# Patient Record
Sex: Male | Born: 1985 | Race: Black or African American | Hispanic: No | Marital: Single | Smoking: Current some day smoker
Health system: Southern US, Community
[De-identification: ages and names within clinical notes are randomized; demographics above are authoritative.]

## PROBLEM LIST (undated history)

## (undated) DIAGNOSIS — E669 Obesity, unspecified: Secondary | ICD-10-CM

## (undated) HISTORY — PX: HAND SURGERY: SHX662

---

## 2008-11-16 ENCOUNTER — Emergency Department (HOSPITAL_BASED_OUTPATIENT_CLINIC_OR_DEPARTMENT_OTHER): Admission: EM | Admit: 2008-11-16 | Discharge: 2008-11-16 | Payer: Self-pay | Admitting: Emergency Medicine

## 2009-09-06 ENCOUNTER — Emergency Department (HOSPITAL_BASED_OUTPATIENT_CLINIC_OR_DEPARTMENT_OTHER): Admission: EM | Admit: 2009-09-06 | Discharge: 2009-09-06 | Payer: Self-pay | Admitting: Emergency Medicine

## 2009-11-06 ENCOUNTER — Ambulatory Visit: Payer: Self-pay | Admitting: Diagnostic Radiology

## 2009-11-06 ENCOUNTER — Emergency Department (HOSPITAL_BASED_OUTPATIENT_CLINIC_OR_DEPARTMENT_OTHER): Admission: EM | Admit: 2009-11-06 | Discharge: 2009-11-06 | Payer: Self-pay | Admitting: Emergency Medicine

## 2010-01-07 ENCOUNTER — Emergency Department (HOSPITAL_BASED_OUTPATIENT_CLINIC_OR_DEPARTMENT_OTHER): Admission: EM | Admit: 2010-01-07 | Discharge: 2010-01-07 | Payer: Self-pay | Admitting: Emergency Medicine

## 2010-01-07 ENCOUNTER — Ambulatory Visit: Payer: Self-pay | Admitting: Diagnostic Radiology

## 2010-07-09 ENCOUNTER — Emergency Department (HOSPITAL_BASED_OUTPATIENT_CLINIC_OR_DEPARTMENT_OTHER): Admission: EM | Admit: 2010-07-09 | Discharge: 2010-07-09 | Payer: Self-pay | Admitting: Emergency Medicine

## 2010-07-09 ENCOUNTER — Ambulatory Visit: Payer: Self-pay | Admitting: Radiology

## 2010-07-23 ENCOUNTER — Ambulatory Visit (HOSPITAL_BASED_OUTPATIENT_CLINIC_OR_DEPARTMENT_OTHER): Admission: RE | Admit: 2010-07-23 | Discharge: 2010-07-23 | Payer: Self-pay | Admitting: Plastic Surgery

## 2010-08-14 ENCOUNTER — Ambulatory Visit: Payer: Self-pay | Admitting: Diagnostic Radiology

## 2010-08-14 ENCOUNTER — Encounter
Admission: RE | Admit: 2010-08-14 | Discharge: 2010-09-19 | Payer: Self-pay | Source: Home / Self Care | Attending: Plastic Surgery | Admitting: Plastic Surgery

## 2010-08-14 ENCOUNTER — Emergency Department (HOSPITAL_BASED_OUTPATIENT_CLINIC_OR_DEPARTMENT_OTHER): Admission: EM | Admit: 2010-08-14 | Discharge: 2010-08-14 | Payer: Self-pay | Admitting: Emergency Medicine

## 2010-09-19 ENCOUNTER — Encounter
Admission: RE | Admit: 2010-09-19 | Discharge: 2010-10-05 | Payer: Self-pay | Source: Home / Self Care | Attending: Plastic Surgery | Admitting: Plastic Surgery

## 2010-11-25 ENCOUNTER — Emergency Department (HOSPITAL_BASED_OUTPATIENT_CLINIC_OR_DEPARTMENT_OTHER)
Admission: EM | Admit: 2010-11-25 | Discharge: 2010-11-25 | Disposition: A | Discharge status: Home / Self Care | Payer: Self-pay | Attending: Emergency Medicine | Admitting: Emergency Medicine

## 2010-11-25 ENCOUNTER — Emergency Department (INDEPENDENT_AMBULATORY_CARE_PROVIDER_SITE_OTHER): Payer: Self-pay

## 2010-11-25 DIAGNOSIS — J45909 Unspecified asthma, uncomplicated: Secondary | ICD-10-CM

## 2010-11-25 DIAGNOSIS — R0602 Shortness of breath: Secondary | ICD-10-CM | POA: Insufficient documentation

## 2010-11-25 DIAGNOSIS — J45901 Unspecified asthma with (acute) exacerbation: Secondary | ICD-10-CM | POA: Insufficient documentation

## 2010-12-04 LAB — RAPID STREP SCREEN (MED CTR MEBANE ONLY): Streptococcus, Group A Screen (Direct): NEGATIVE

## 2010-12-05 LAB — POCT HEMOGLOBIN-HEMACUE: Hemoglobin: 14.7 g/dL (ref 13.0–17.0)

## 2011-05-15 ENCOUNTER — Emergency Department (HOSPITAL_BASED_OUTPATIENT_CLINIC_OR_DEPARTMENT_OTHER)
Admission: EM | Admit: 2011-05-15 | Discharge: 2011-05-15 | Disposition: A | Discharge status: Home / Self Care | Payer: Self-pay | Attending: Emergency Medicine | Admitting: Emergency Medicine

## 2011-05-15 ENCOUNTER — Encounter: Payer: Self-pay | Admitting: *Deleted

## 2011-05-15 ENCOUNTER — Other Ambulatory Visit: Payer: Self-pay

## 2011-05-15 ENCOUNTER — Emergency Department (INDEPENDENT_AMBULATORY_CARE_PROVIDER_SITE_OTHER): Payer: Self-pay

## 2011-05-15 DIAGNOSIS — J45909 Unspecified asthma, uncomplicated: Secondary | ICD-10-CM | POA: Insufficient documentation

## 2011-05-15 DIAGNOSIS — R079 Chest pain, unspecified: Secondary | ICD-10-CM | POA: Insufficient documentation

## 2011-05-15 DIAGNOSIS — R0789 Other chest pain: Secondary | ICD-10-CM

## 2011-05-15 NOTE — ED Provider Notes (Signed)
History     CSN: [id number] Arrival date & time: 05/15/2011  4:48 PM  Chief Complaint  Patient presents with  . Chest Pain   Patient is a 25 y.o. male presenting with chest pain. The history is provided by the patient (Girlfriend).  Chest Pain The chest pain began 1 - 2 hours ago. Duration of episode(s) is 1 hour. Chest pain occurs constantly. The chest pain is improving. The pain is associated with stress (Occurred during a heated discussion regarding finances). The severity of the pain is moderate. The quality of the pain is described as aching. The pain does not radiate. Chest pain is worsened by stress. Pertinent negatives for primary symptoms include no fever, no fatigue, no syncope, no cough, no wheezing, no palpitations, no nausea, no vomiting and no dizziness. Primary symptoms comment: No diaphoresis no near-syncope.  No palpitations  Pertinent negatives for associated symptoms include no diaphoresis. He tried nothing for the symptoms. Risk factors: Father with a history of nonischemic cardiomyopathy.. no family history of early coronary artery disease.  No early death. Past medical history comments: Patient smokes tobacco and marijuana.     Past Medical History  Diagnosis Date  . Asthma     History reviewed. No pertinent past surgical history.  History reviewed. No pertinent family history.  History  Substance Use Topics  . Smoking status: Never Smoker   . Smokeless tobacco: Not on file  . Alcohol Use: Yes     occ      Review of Systems  Constitutional: Negative for fever, diaphoresis and fatigue.  Respiratory: Negative for cough and wheezing.   Cardiovascular: Positive for chest pain. Negative for palpitations and syncope.  Gastrointestinal: Negative for nausea and vomiting.  Neurological: Negative for dizziness.  All other systems reviewed and are negative.    Physical Exam  BP 125/70  Pulse 97  Temp(Src) 98.6 F (37 C) (Oral)  Resp 22  SpO2  100%  Physical Exam  Nursing note and vitals reviewed. Constitutional: He is oriented to person, place, and time. He appears well-developed and well-nourished.  HENT:  Head: Normocephalic and atraumatic.  Eyes: EOM are normal.  Neck: Normal range of motion.  Cardiovascular: Normal rate, regular rhythm, normal heart sounds and intact distal pulses.   Pulmonary/Chest: Effort normal and breath sounds normal. No respiratory distress.  Abdominal: Soft. He exhibits no distension. There is no tenderness.  Musculoskeletal: Normal range of motion.  Neurological: He is alert and oriented to person, place, and time.  Skin: Skin is warm and dry.  Psychiatric: He has a normal mood and affect. Judgment normal.    ED Course  Procedures  MDM  Date: 05/15/2011  Rate: 95  Rhythm: normal sinus rhythm  QRS Axis: normal  Intervals: normal  ST/T Wave abnormalities: normal  Conduction Disutrbances:none  Narrative Interpretation:   Old EKG Reviewed: none available  My suspicion for acute coronary syndrome in this 25 year old he is very low.  EKG is normal.  Chest x-ray pending at this time.  Likely home with outpatient cardiology followup.  Suspect this is related to stress and anxiety of his financial discussion.   Dg Chest 2 View  05/15/2011  *RADIOLOGY REPORT*  Clinical Data: Left-sided chest pain  CHEST - 2 VIEW  Comparison: 11/25/2010  Findings:  Heart size upper normal.  Negative for heart failure. Negative for pneumonia or effusion.  Lungs are clear.  IMPRESSION: No active cardiopulmonary disease.  Original Report Authenticated By: Camelia Phenes, M.D.  Lyanne Co, MD 05/15/11 [phone]

## 2011-05-15 NOTE — ED Notes (Signed)
Pt states was at work and got a bit upset started having some tightness in chest states he used his inhaler thinking it might help denies nausea vomiting diaphoresis pt talking on cell phone in triage

## 2014-03-05 ENCOUNTER — Encounter (HOSPITAL_BASED_OUTPATIENT_CLINIC_OR_DEPARTMENT_OTHER): Payer: Self-pay | Admitting: Emergency Medicine

## 2014-03-05 ENCOUNTER — Telehealth (HOSPITAL_BASED_OUTPATIENT_CLINIC_OR_DEPARTMENT_OTHER): Payer: Self-pay | Admitting: *Deleted

## 2014-03-05 ENCOUNTER — Emergency Department (HOSPITAL_BASED_OUTPATIENT_CLINIC_OR_DEPARTMENT_OTHER)
Admission: EM | Admit: 2014-03-05 | Discharge: 2014-03-05 | Disposition: A | Discharge status: Home / Self Care | Payer: 59 | Attending: Emergency Medicine | Admitting: Emergency Medicine

## 2014-03-05 ENCOUNTER — Emergency Department (HOSPITAL_BASED_OUTPATIENT_CLINIC_OR_DEPARTMENT_OTHER): Payer: 59

## 2014-03-05 DIAGNOSIS — IMO0002 Reserved for concepts with insufficient information to code with codable children: Secondary | ICD-10-CM | POA: Insufficient documentation

## 2014-03-05 DIAGNOSIS — Z79899 Other long term (current) drug therapy: Secondary | ICD-10-CM | POA: Insufficient documentation

## 2014-03-05 DIAGNOSIS — J45901 Unspecified asthma with (acute) exacerbation: Secondary | ICD-10-CM

## 2014-03-05 DIAGNOSIS — E669 Obesity, unspecified: Secondary | ICD-10-CM | POA: Insufficient documentation

## 2014-03-05 MED ORDER — IPRATROPIUM BROMIDE 0.02 % IN SOLN
0.5000 mg | Freq: Once | RESPIRATORY_TRACT | Status: AC
  Administered 2014-03-05: 0.5 mg via RESPIRATORY_TRACT
  Filled 2014-03-05: qty 2.5

## 2014-03-05 MED ORDER — PREDNISONE 50 MG PO TABS
60.0000 mg | ORAL_TABLET | Freq: Once | ORAL | Status: AC
  Administered 2014-03-05: 60 mg via ORAL
  Filled 2014-03-05 (×2): qty 1

## 2014-03-05 MED ORDER — ALBUTEROL SULFATE (2.5 MG/3ML) 0.083% IN NEBU
2.5000 mg | INHALATION_SOLUTION | Freq: Once | RESPIRATORY_TRACT | Status: AC
  Administered 2014-03-05: 2.5 mg via RESPIRATORY_TRACT
  Filled 2014-03-05: qty 3

## 2014-03-05 MED ORDER — PREDNISONE 10 MG PO TABS: 40.0000 mg | ORAL_TABLET | Freq: Every day | ORAL | Status: DC

## 2014-03-05 MED ORDER — ALBUTEROL SULFATE HFA 108 (90 BASE) MCG/ACT IN AERS
2.0000 | INHALATION_SPRAY | Freq: Four times a day (QID) | RESPIRATORY_TRACT | Status: DC
  Administered 2014-03-05: 2 via RESPIRATORY_TRACT
  Filled 2014-03-05: qty 6.7

## 2014-03-05 NOTE — Discharge Instructions (Signed)
Asthma, Acute Bronchospasm °Acute bronchospasm caused by asthma is also referred to as an asthma attack. Bronchospasm means your air passages become narrowed. The narrowing is caused by inflammation and tightening of the muscles in the air tubes (bronchi) in your lungs. This can make it hard to breath or cause you to wheeze and cough. °CAUSES °Possible triggers are: °· Animal dander from the skin, hair, or feathers of animals. °· Dust mites contained in house dust. °· Cockroaches. °· Pollen from trees or grass. °· Mold. °· Cigarette or tobacco smoke. °· Air pollutants such as dust, household cleaners, hair sprays, aerosol sprays, paint fumes, strong chemicals, or strong odors. °· Cold air or weather changes. Cold air may trigger inflammation. Winds increase molds and pollens in the air. °· Strong emotions such as crying or laughing hard. °· Stress. °· Certain medicines such as aspirin or beta-blockers. °· Sulfites in foods and drinks, such as dried fruits and wine. °· Infections or inflammatory conditions, such as a flu, cold, or inflammation of the nasal membranes (rhinitis). °· Gastroesophageal reflux disease (GERD). GERD is a condition where stomach acid backs up into your throat (esophagus). °· Exercise or strenuous activity. °SIGNS AND SYMPTOMS  °· Wheezing. °· Excessive coughing, particularly at night. °· Chest tightness. °· Shortness of breath. °DIAGNOSIS  °Your health care provider will ask you about your medical history and perform a physical exam. A chest X-ray or blood testing may be performed to look for other causes of your symptoms or other conditions that may have triggered your asthma attack.  °TREATMENT  °Treatment is aimed at reducing inflammation and opening up the airways in your lungs.  Most asthma attacks are treated with inhaled medicines. These include quick relief or rescue medicines (such as bronchodilators) and controller medicines (such as inhaled corticosteroids). These medicines are  sometimes given through an inhaler or a nebulizer. Systemic steroid medicine taken by mouth or given through an IV tube also can be used to reduce the inflammation when an attack is moderate or severe. Antibiotic medicines are only used if a bacterial infection is present.  °HOME CARE INSTRUCTIONS  °· Rest. °· Drink plenty of liquids. This helps the mucus to remain thin and be easily coughed up. Only use caffeine in moderation and do not use alcohol until you have recovered from your illness. °· Do not smoke. Avoid being exposed to secondhand smoke. °· You play a critical role in keeping yourself in good health. Avoid exposure to things that cause you to wheeze or to have breathing problems. °· Keep your medicines up to date and available. Carefully follow your health care provider's treatment plan. °· Take your medicine exactly as prescribed. °· When pollen or pollution is bad, keep windows closed and use an air conditioner or go to places with air conditioning. °· Asthma requires careful medical care. See your health care provider for a follow-up as advised. If you are more than [redacted] weeks pregnant and you were prescribed any new medicines, let your obstetrician know about the visit and how you are doing. Follow-up with your health care provider as directed. °· After you have recovered from your asthma attack, make an appointment with your outpatient doctor to talk about ways to reduce the likelihood of future attacks. If you do not have a doctor who manages your asthma, make an appointment with a primary care doctor to discuss your asthma. °SEEK IMMEDIATE MEDICAL CARE IF:  °· You are getting worse. °· You have trouble breathing. If severe, call   your local emergency services (911 in the U.S.).  You develop chest pain or discomfort.  You are vomiting.  You are not able to keep fluids down.  You are coughing up yellow, green, brown, or bloody sputum.  You have a fever and your symptoms suddenly get  worse.  You have trouble swallowing. MAKE SURE YOU:   Understand these instructions.  Will watch your condition.  Will get help right away if you are not doing well or get worse. Document Released: 12/25/2006 Document Revised: 05/12/2013 Document Reviewed: 03/17/2013 [facility] Patient Information 2014 [place], [place]. You do not have pneumonia on your x-ray been started on a worse.  Of steroids.  Please take this as directed for the next 5, days.  You've also been supplied with an additional inhaler.  Please make every effort to find a primary care physician   Emergency Department Resource Guide 1) Find a Doctor and Pay Out of Pocket Although you won't have to find out who is covered by your insurance plan, it is a good idea to ask around and get recommendations. You will then need to call the office and see if the doctor you have chosen will accept you as a new patient and what types of options they offer for patients who are self-pay. Some doctors offer discounts or will set up payment plans for their patients who do not have insurance, but you will need to ask so you aren't surprised when you get to your appointment.  2) Contact Your Local Health Department Not all health departments have doctors that can see patients for sick visits, but many do, so it is worth a call to see if yours does. If you don't know where your local health department is, you can check in your phone book. The CDC also has a tool to help you locate your state's health department, and many state websites also have listings of all of their local health departments.  3) Find a Walk-in Clinic If your illness is not likely to be very severe or complicated, you may want to try a walk in clinic. These are popping up all over the country in pharmacies, drugstores, and shopping centers. They're usually staffed by nurse practitioners or physician assistants that have been trained to treat common illnesses and complaints.  They're usually fairly quick and inexpensive. However, if you have serious medical issues or chronic medical problems, these are probably not your best option.  No Primary Care Doctor: - Call Health Connect at  [phone] - they can help you locate a primary care doctor that  accepts your insurance, provides certain services, etc. - Physician Referral Service- [phone]  Chronic Pain Problems: Organization         Address  Phone   Notes  [facility]  [phone]) [phone] Patients need to be referred by their primary care doctor.   Medication Assistance: Organization         Address  Phone   Notes  [facility] Medication [facility] [address] [place], [place] [phone]) [phone] --Must be a resident of [place] -- Must have NO insurance coverage whatsoever (no Medicaid/ Medicare, etc.) -- The pt. MUST have a primary care doctor that directs their care regularly and follows them in the community   MedAssist  [phone]) [phone]   [employer]  [phone]    Agencies that provide inexpensive medical care: Organization         Address  Phone   Notes  [facility]  [phone]   [facility]    [phone]   [facility] [place], [place] [address] [phone]   [place]. [address] [place] [phone]   Planned Parenthood    [phone]   [facility]    [phone]   [place] and [address] [address], [place] Phone:  [phone], Fax:  [phone] Hours of Operation:  9 am - 6 pm, M-F.  Also accepts Medicaid/Medicare and self-pay.  [facility] for [place]annah [address], [address], [place] Phone: [phone], Fax: [phone]. Hours of Operation:  8:30 am - 5:30 pm, M-F.  Also accepts Medicaid and self-pay.  [facility] [place] [address] [place]  Phone: [phone]   [place], [place], [place] [phone], Ext. [phone] Mondays & Thursdays: 7-9 AM.  First 15 patients are seen on a first come, first serve basis.    [place] Providers:  Organization         Address  Phone   Notes  [facility] [address], [place] [phone] Also accepts self-pay patients.  [facility] [address], [place]  [phone]   [place], [address], [place] [phone]   [facility] [address] [place] [phone]   Lucianne Lei [address], [place]   [phone] Only accepts [place] Access [place] patients after they have their name applied to their card.   Self-Pay (no insurance) in [facility]:  Organization         Address  Phone   Notes  Sickle Cell Patients, [facility] [place] [phone]   [facility] [place] [phone]   [facility] [place]  [place], [place],  [phone]   [facility]/Dr. Osei-Bonsu  [address] [place] [place] [address], [place] [phone] Phone number for both [place] and [place] locations is the same.  [facility] [address] [place] [phone]   [facility] [address], [place] or [address] [phone]   [facility] [address] [place] [phone], phone; [phone], fax Sees patients 1st and 3rd Saturday of every month.  Must not qualify for public or private insurance (i.e. Medicaid, Medicare, [place] Health Choice, Veterans' Benefits)  Household income should be no more than 200% of the poverty level The clinic cannot  treat you if you are pregnant or think you are pregnant  Sexually transmitted diseases are not treated at the clinic.    Dental Care: Organization         Address  Phone  Notes  [facility] Department of [facility] [place] [phone] Accepts children up to age 77 who are enrolled in [place] or [place]; pregnant women with a Medicaid card; and children who have applied for Medicaid or [place] Health Choice, but were declined, whose parents can pay a reduced fee at time of service.  [facility] Department of [facility]  [address], [place] [phone] Accepts children up to age 90 who are enrolled in [place] or [place]; pregnant women with a Medicaid card; and children who have applied for Medicaid or [place] Health Choice, but were declined, whose parents can pay a reduced fee at time of service.  [place] Adult Dental Access PROGRAM  [place] [phone] Patients are seen by appointment only. Walk-ins are not accepted. [place] will see patients 74 years of age and older. Monday - Tuesday (8am-5pm) Most Wednesdays (8:30-5pm) $30 per visit, cash only  [facility] Adult Dental Access PROGRAM  [address], [facility] [phone] Patients are seen by appointment only. Walk-ins are not accepted. [place] will see patients 7 years of age and older. One Wednesday Evening (Monthly: Volunteer Based).  $30 per visit, cash only  [place]  [phone] for adults; Children under age 72, call Graduate Pediatric Dentistry at [phone]. Children aged 21-14, please call [phone] to request a pediatric application.  Dental services are provided in all areas of dental care including fillings, crowns and bridges, complete and partial dentures, implants, gum treatment, root canals, and extractions. Preventive care is also provided.  Treatment is provided to both adults and children. Patients are selected via a lottery and there is often a waiting list.   [facility] [address] [place]  [phone] [email or web address]   [facility] [address] [place], [place] [phone], Ext. [phone] Second and Fourth Thursday of each month, opens at 6:30 AM; Clinic ends at 9 AM.  Patients are seen on a first-come first-served basis, and a limited number are seen during each clinic.   [facility]  [address] Hillard Danker [place], [place] [phone]   Eligibility Requirements You must have lived in [place], [place], or [place] counties for at least the last three months.   You cannot be eligible for state or federal sponsored [employer], including [employer], [place], or [employer].   You generally cannot be eligible for healthcare insurance through your employer.    How to apply: Eligibility screenings are held every Tuesday and Wednesday afternoon from 1:00 pm until 4:00 pm. You do not need an appointment for the interview!  [facility] [address] [place], [place]   [place]  [place] Department  [place]  [phone]    Behavioral Health Resources in the Community: Intensive Outpatient Programs Organization         Address  Phone  Notes  [place] [facility]. [address], [place], [place] [phone]   [facility] Outpatient [address] [place], [place]   ADS: Alcohol & Drug Svcs [address], [place], [place]   [place] [address]  [place], [place] or [phone]   Substance Abuse Resources Organization         Address  Phone  Notes  Alcohol and Drug Services  [phone]   [place]   [phone]   The [place]   Chinita Pester  [phone]   Residential & Outpatient Substance Abuse Program  [phone]   Psychological Services Organization         Address  Phone  Notes  [facility]  [phone]- [phone]   [facility]  [phone]- [phone]   [facility] [address], [place] [phone] or [phone]    Mobile Crisis Teams Organization         Address  Phone  Notes  [facility], Mobile Crisis Care Unit  [phone]   [facility]  [address] [place], [place] [phone]   Doristine LocksSharon DeEsch [address] [place] [phone]    Self-Help/Support Groups Organization         Address  Phone             Notes  Mental Health Assoc. of [place] - variety of support groups  [phone]- [phone] Call for more information  Narcotics Anonymous (NA), Caring Services [address], [place]  2 meetings at this location   Statisticianesidential Treatment Programs Organization         Address  Phone  Notes  [facility] [address],    [place]  [phone]   [facility]  [address], [place] [phone], [place], [place] [phone]   [facility] [address], [place] [phone] Admissions: 8am-3pm M-F  [facility] [address]    [place], [place] [phone]   [facility] [address], [place], [place] [phone]   [facility] [address]  [place], [place] [phone]   [facility] - Intensive Outpatient [address], [place], [place] [phone]   [facility] ([facility]iction Recovery [facility] [address]  [place], [place] [phone] or [phone]   [facility] ([facility]) [address] [place], [place] [phone] Accepts Medicaid  [facility] [address]  [place] [phone] Substance  Abuse/Addiction Treatment   [facility] Behavioral Health Resources Organization         Address  Phone  Notes  [facility]  [phone]) [phone]   Angie FavaJulie Brannon, PhD [address], [address] [place], [place]   [phone](336) [phone] or [phone]   [facility]   [address] [place], [place] [phone](336) [phone]   [facility] [address], [place], [place] [phone]) [phone] Insurance/Medicaid/sponsorship through [facility]  Faith and Families [address]                                    [place], [place] [phone]) [phone] Therapy/tele-psych/case  [facility] [address].   [place], [place] [phone]    Dr. Lolly MustacheArfeen  [phone]) [phone]   [facility]  [employer] [facility] 1) [address], [place] 2) [address], [place] 3)  [address], [place] [phone](336) [phone](336) [phone]  [phone](336) [phone]   [facility] Child Abuse Hotline [phone](336) [phone] or [phone](336) [phone] (After Hours)

## 2014-03-05 NOTE — ED Notes (Signed)
C/o asthma exacerbation, symptoms started yesterday.  C/o feeling short of breath-having to use rescue inhaler approx Q 2 hours, waking up t/o the night.  C/o cough, very little sputum production.

## 2014-03-05 NOTE — ED Provider Notes (Signed)
Medical screening examination/treatment/procedure(s) were performed by non-physician practitioner and as supervising physician I was immediately available for consultation/collaboration.     Murphy Duzan, MD 03/05/14 2205 

## 2014-03-05 NOTE — Telephone Encounter (Signed)
Pharmacist called to clarify rx for Prednisone.  Per PA Earley FavorGail Schulz pt should be taking 40mg  total of Prednisone daily x 5 days, dispense: QS.  This [facility].

## 2014-03-05 NOTE — ED Provider Notes (Signed)
CSN: [id number]     Arrival date & time 03/05/14  2013 History   First MD Initiated Contact with Patient 03/05/14 2019     Chief Complaint  Patient presents with  . Asthma     (Consider location/radiation/quality/duration/timing/severity/associated sxs/prior Treatment) HPI Comments: Patient with a history of, asthma.  No significant hospitalizations, except as a small child uses albuterol inhaler, as a preventive and emergency treatment.  He uses no other inhalation medicine.  Reports, that for the past 2, days.  She's had to use his inhaler every 2, hours he's been awakened during the night with shortness of breath.  Denies any fever, UTI symptoms, but states he feels, like he's been coming down with something.  Patient is a 28 y.o. male presenting with asthma. The history is provided by the patient.  Asthma This is a recurrent problem. The current episode started yesterday. The problem occurs intermittently. The problem has been gradually worsening. Pertinent negatives include no chest pain, chills, coughing or fever. The symptoms are aggravated by exertion. Treatments tried: Albuterol neb. The treatment provided no relief.    Past Medical History  Diagnosis Date  . Asthma    History reviewed. No pertinent past surgical history. No family history on file. History  Substance Use Topics  . Smoking status: Never Smoker   . Smokeless tobacco: Not on file  . Alcohol Use: Yes     Comment: occ    Review of Systems  Constitutional: Negative for fever and chills.  Respiratory: Positive for shortness of breath and wheezing. Negative for cough.   Cardiovascular: Negative for chest pain.  All other systems reviewed and are negative.     Allergies  Review of patient's allergies indicates no known allergies.  Home Medications   Prior to Admission medications   Medication Sig Start Date End Date Taking? Authorizing Provider  albuterol (PROVENTIL,VENTOLIN) 90 MCG/ACT inhaler Inhale 2  puffs into the lungs every 6 (six) hours as needed. Shortness of breath and wheezing     Historical Provider, MD  aspirin 81 MG chewable tablet Chew 81 mg by mouth once.      Historical Provider, MD  predniSONE (DELTASONE) 10 MG tablet Take 4 tablets (40 mg total) by mouth daily. 03/05/14   Arman FilterGail K Altheria Shadoan, NP  Prenatal Vit-Fe Fumarate-FA (PRENATAL 19) tablet Chew 1-2 tablets by mouth daily.      Historical Provider, MD   BP 116/65  Pulse 104  Temp(Src) 98.3 F (36.8 C) (Oral)  Resp 16  Ht 5\' 9"  (1.753 m)  Wt 300 lb (136.079 kg)  BMI 44.28 kg/m2  SpO2 100% Physical Exam  Nursing note and vitals reviewed. Constitutional: He appears well-developed and well-nourished.  Obese  HENT:  Head: Normocephalic.  Eyes: Pupils are equal, round, and reactive to light.  Neck: Normal range of motion.  Cardiovascular: Normal rate and regular rhythm.   Pulmonary/Chest: Bradypnea noted. No respiratory distress. He has decreased breath sounds. He has no wheezes. He exhibits no tenderness.  Creased breath sounds throughout    ED Course  Procedures (including critical care time) Labs Review Labs Reviewed - No data to display  Imaging Review Dg Chest 2 View  03/05/2014   CLINICAL DATA:  Asthma exacerbation.  Cough.  EXAM: CHEST  2 VIEW  COMPARISON:  Two-view chest 05/15/2011.  FINDINGS: The heart size and mediastinal contours are within normal limits. Both lungs are clear. The visualized skeletal structures are unremarkable.  IMPRESSION: Negative two view chest.   Electronically Signed  By: Gennette Pachris  Mattern M.D.   On: 03/05/2014 20:57     EKG Interpretation None      MDM  On initial examination, patient was very tight and wheezing.  Very little air.  There was no wheezing.  He was given albuterol, Atrovent treatment in the emergency department.  He is now moving much more air.  Feels, like he can't get a full lung full of air.  I will start patient on oral steroid worst for 5 days, as well as renew  his albuterol inhaler.  Patient has been instructed to return at any time.  He, feels, like he is not getting, a full breath.  Because worsening Final diagnoses:  Asthma exacerbation         Arman FilterGail K Kristina Mcnorton, NP 03/05/14 2124

## 2015-05-31 ENCOUNTER — Emergency Department (HOSPITAL_BASED_OUTPATIENT_CLINIC_OR_DEPARTMENT_OTHER): Payer: 59

## 2015-05-31 ENCOUNTER — Emergency Department (HOSPITAL_BASED_OUTPATIENT_CLINIC_OR_DEPARTMENT_OTHER)
Admission: EM | Admit: 2015-05-31 | Discharge: 2015-05-31 | Disposition: A | Discharge status: Home / Self Care | Payer: 59 | Attending: Emergency Medicine | Admitting: Emergency Medicine

## 2015-05-31 ENCOUNTER — Encounter (HOSPITAL_BASED_OUTPATIENT_CLINIC_OR_DEPARTMENT_OTHER): Payer: Self-pay | Admitting: Emergency Medicine

## 2015-05-31 DIAGNOSIS — Z7982 Long term (current) use of aspirin: Secondary | ICD-10-CM | POA: Diagnosis not present

## 2015-05-31 DIAGNOSIS — E669 Obesity, unspecified: Secondary | ICD-10-CM | POA: Diagnosis not present

## 2015-05-31 DIAGNOSIS — R112 Nausea with vomiting, unspecified: Secondary | ICD-10-CM | POA: Diagnosis not present

## 2015-05-31 DIAGNOSIS — Z79899 Other long term (current) drug therapy: Secondary | ICD-10-CM | POA: Insufficient documentation

## 2015-05-31 DIAGNOSIS — Z87891 Personal history of nicotine dependence: Secondary | ICD-10-CM | POA: Diagnosis not present

## 2015-05-31 DIAGNOSIS — J45901 Unspecified asthma with (acute) exacerbation: Secondary | ICD-10-CM | POA: Insufficient documentation

## 2015-05-31 DIAGNOSIS — R05 Cough: Secondary | ICD-10-CM | POA: Diagnosis not present

## 2015-05-31 DIAGNOSIS — R059 Cough, unspecified: Secondary | ICD-10-CM

## 2015-05-31 HISTORY — DX: Obesity, unspecified: E66.9

## 2015-05-31 MED ORDER — ONDANSETRON 4 MG PO TBDP: ORAL_TABLET | ORAL | Status: AC

## 2015-05-31 MED ORDER — PREDNISONE 20 MG PO TABS: 60.0000 mg | ORAL_TABLET | Freq: Every day | ORAL | Status: DC

## 2015-05-31 MED ORDER — IPRATROPIUM-ALBUTEROL 0.5-2.5 (3) MG/3ML IN SOLN
3.0000 mL | RESPIRATORY_TRACT | Status: DC
  Administered 2015-05-31: 3 mL via RESPIRATORY_TRACT
  Filled 2015-05-31: qty 3

## 2015-05-31 NOTE — ED Notes (Signed)
Cough since 6am.  Sts he is having "hot and cold flashes" and is coughing up green sputum.

## 2015-05-31 NOTE — ED Notes (Addendum)
Upon entering room to discharge pt, pt and his visitor expressed complaints that the pt was "not given anything while we were here" to alleviate sx. RN was initially unable to locate Dr. Littie Deeds, and told pt and his visitor that they may wait until he is available to order something or leave with the prescription that was provided in discharge per Dr. Janann August original plan. Pt stated he wasn't going to "wait around on him" and would leave. Pt then asked where he could file a complaint about his care today. RN reminded pt that if he was unsatisfied with his care that Dr. Littie Deeds could come speak to him as soon as he was available. RN located Dr. Littie Deeds who went to speak with pt.

## 2015-05-31 NOTE — ED Provider Notes (Signed)
CSN: [id number]     Arrival date & time 05/31/15  1753 History  This chart was scribed for Mirian Mo, MD by Tanda Rockers, ED Scribe. This patient was seen in room MH12/MH12 and the patient's care was started at 6:37 PM.  Chief Complaint  Patient presents with  . Cough   Patient is a 29 y.o. male presenting with cough. The history is provided by the patient. No language interpreter was used.  Cough Cough characteristics:  Productive Sputum characteristics:  Green Severity:  Unable to specify Onset quality:  Sudden Duration:  12 hours Timing:  Sporadic Chronicity:  New Smoker: no (Former)   Context: sick contacts   Associated symptoms: fever (Subjective) and shortness of breath   Associated symptoms: no sore throat      HPI Comments: Romano Stigger is a 29 y.o. male with hx asthma who presents to the Emergency Department complaining of productive cough with green phlegm that began today around 6 AM (approximately 12.5 hours ago). Pt also complains of shortness of breath, chest tightness, subjective fever, nausea, and vomiting. He used his albuterol inhaler this morning with mild relief. Denies sore throat, diarrhea, constipation, or any other associated symptoms. Recent sick contact with similar symptoms with child.   Past Medical History  Diagnosis Date  . Asthma   . Obesity    Past Surgical History  Procedure Laterality Date  . Hand surgery     No family history on file. Social History  Substance Use Topics  . Smoking status: Former Games developer  . Smokeless tobacco: None  . Alcohol Use: Yes     Comment: occ    Review of Systems  Constitutional: Positive for fever (Subjective).  HENT: Negative for sore throat.   Respiratory: Positive for cough, chest tightness and shortness of breath.   Gastrointestinal: Positive for nausea and vomiting. Negative for diarrhea and constipation.  All other systems reviewed and are negative.  Allergies  Review of patient's allergies  indicates no known allergies.  Home Medications   Prior to Admission medications   Medication Sig Start Date End Date Taking? Authorizing Provider  albuterol (PROVENTIL,VENTOLIN) 90 MCG/ACT inhaler Inhale 2 puffs into the lungs every 6 (six) hours as needed. Shortness of breath and wheezing     Historical Provider, MD  aspirin 81 MG chewable tablet Chew 81 mg by mouth once.      Historical Provider, MD  ondansetron (ZOFRAN ODT) 4 MG disintegrating tablet 4mg  ODT q4 hours prn nausea/vomit 05/31/15   Mirian Mo, MD  predniSONE (DELTASONE) 20 MG tablet Take 3 tablets (60 mg total) by mouth daily. 05/31/15   Mirian Mo, MD  Prenatal Vit-Fe Fumarate-FA (PRENATAL 19) tablet Chew 1-2 tablets by mouth daily.      Historical Provider, MD   Triage Vitals: BP 125/63 mmHg  Pulse 94  Temp(Src) 99 F (37.2 C) (Oral)  Resp 20  Ht 5\' 10"  (1.778 m)  Wt 299 lb (135.626 kg)  BMI 42.90 kg/m2  SpO2 98%   Physical Exam  Constitutional: He is oriented to person, place, and time. He appears well-developed and well-nourished.  HENT:  Head: Normocephalic and atraumatic.  Eyes: Conjunctivae and EOM are normal.  Neck: Normal range of motion. Neck supple.  Cardiovascular: Normal rate, regular rhythm and normal heart sounds.   Pulmonary/Chest: Effort normal. No respiratory distress. He has wheezes (diffuse).  Abdominal: He exhibits no distension. There is no tenderness. There is no rebound and no guarding.  Musculoskeletal: Normal range of motion.  Neurological: He is alert and oriented to person, place, and time.  Skin: Skin is warm and dry.  Vitals reviewed.   ED Course  Procedures (including critical care time)  DIAGNOSTIC STUDIES: Oxygen Saturation is 98% on RA, normal by my interpretation.    COORDINATION OF CARE: 6:40 PM-Discussed treatment plan with pt at bedside and pt agreed to plan.   Labs Review Labs Reviewed - No data to display  Imaging Review Dg Chest 2 View  05/31/2015    CLINICAL DATA:  Shortness of breath and cough for 12 hours.  EXAM: CHEST  2 VIEW  COMPARISON:  PA and lateral chest 03/05/2014 and 05/15/2011.  FINDINGS: Heart size and mediastinal contours are within normal limits. Both lungs are clear. Visualized skeletal structures are unremarkable.  IMPRESSION: Negative exam.   Electronically Signed   By: Drusilla Kanner M.D.   On: 05/31/2015 18:32   I have personally reviewed and evaluated these images and lab results as part of my medical decision-making.   EKG Interpretation None      MDM   Final diagnoses:  Cough    29 y.o. male with pertinent PMH of asthma presents with cough as above. Symptoms present for 12 hours. No fever.  Nonproductive cough on my history. On arrival vital signs and physical exam as above. Diffuse but very mild wheezing present. Patient states that he has had relief with home albuterol. Workup as above demonstrated a clear chest x-ray. Patient did not state he was dyspneic during my exam. Discussed that he likely had a viral illness for which there was no cure, he was given a steroid prescription per the original plan. He apparently seemed unhappy with this plan.The patient requested therapy while he was here. DuoNeb given. Discharged home in stable condition.    I have reviewed all laboratory and imaging studies if ordered as above  1. Cough           Mirian Mo, MD 05/31/15 [phone]

## 2015-05-31 NOTE — ED Notes (Signed)
O2 SATS 99% HR 98. Assessed by RT in lobby. Pt reports SOB x 12 hours with Hx of asthma

## 2015-05-31 NOTE — Discharge Instructions (Signed)
Cough, Adult  A cough is a reflex that helps clear your throat and airways. It can help heal the body or may be a reaction to an irritated airway. A cough may only last 2 or 3 weeks (acute) or may last more than 8 weeks (chronic).  CAUSES Acute cough:  Viral or bacterial infections. Chronic cough:  Infections.  Allergies.  Asthma.  Post-nasal drip.  Smoking.  Heartburn or acid reflux.  Some medicines.  Chronic lung problems (COPD).  Cancer. SYMPTOMS   Cough.  Fever.  Chest pain.  Increased breathing rate.  High-pitched whistling sound when breathing (wheezing).  Colored mucus that you cough up (sputum). TREATMENT   A bacterial cough may be treated with antibiotic medicine.  A viral cough must run its course and will not respond to antibiotics.  Your caregiver may recommend other treatments if you have a chronic cough. HOME CARE INSTRUCTIONS   Only take over-the-counter or prescription medicines for pain, discomfort, or fever as directed by your caregiver. Use cough suppressants only as directed by your caregiver.  Use a cold steam vaporizer or humidifier in your bedroom or home to help loosen secretions.  Sleep in a semi-upright position if your cough is worse at night.  Rest as needed.  Stop smoking if you smoke. SEEK IMMEDIATE MEDICAL CARE IF:   You have pus in your sputum.  Your cough starts to worsen.  You cannot control your cough with suppressants and are losing sleep.  You begin coughing up blood.  You have difficulty breathing.  You develop pain which is getting worse or is uncontrolled with medicine.  You have a fever. MAKE SURE YOU:   Understand these instructions.  Will watch your condition.  Will get help right away if you are not doing well or get worse. Document Released: 03/08/2011 Document Revised: 12/02/2011 Document Reviewed: 03/08/2011 Exit[employer] Patient Information 2015 [employer]. This information is not intended  to replace advice given to you by your health care provider. Make sure you discuss any questions you have with your health care provider.  

## 2016-09-08 ENCOUNTER — Emergency Department (HOSPITAL_BASED_OUTPATIENT_CLINIC_OR_DEPARTMENT_OTHER)
Admission: EM | Admit: 2016-09-08 | Discharge: 2016-09-08 | Disposition: A | Discharge status: Home / Self Care | Payer: 59 | Attending: Emergency Medicine | Admitting: Emergency Medicine

## 2016-09-08 ENCOUNTER — Encounter (HOSPITAL_BASED_OUTPATIENT_CLINIC_OR_DEPARTMENT_OTHER): Payer: Self-pay | Admitting: Emergency Medicine

## 2016-09-08 ENCOUNTER — Emergency Department (HOSPITAL_BASED_OUTPATIENT_CLINIC_OR_DEPARTMENT_OTHER): Payer: 59

## 2016-09-08 DIAGNOSIS — J4541 Moderate persistent asthma with (acute) exacerbation: Secondary | ICD-10-CM | POA: Diagnosis not present

## 2016-09-08 DIAGNOSIS — F17210 Nicotine dependence, cigarettes, uncomplicated: Secondary | ICD-10-CM | POA: Diagnosis not present

## 2016-09-08 DIAGNOSIS — R0602 Shortness of breath: Secondary | ICD-10-CM | POA: Diagnosis present

## 2016-09-08 DIAGNOSIS — Z79899 Other long term (current) drug therapy: Secondary | ICD-10-CM | POA: Diagnosis not present

## 2016-09-08 MED ORDER — ALBUTEROL SULFATE (5 MG/ML) 0.5% IN NEBU: 2.5000 mg | INHALATION_SOLUTION | Freq: Four times a day (QID) | RESPIRATORY_TRACT | 12 refills | Status: AC | PRN

## 2016-09-08 MED ORDER — IPRATROPIUM BROMIDE 0.02 % IN SOLN
0.5000 mg | Freq: Once | RESPIRATORY_TRACT | Status: AC
  Administered 2016-09-08: 0.5 mg via RESPIRATORY_TRACT
  Filled 2016-09-08: qty 2.5

## 2016-09-08 MED ORDER — ALBUTEROL (5 MG/ML) CONTINUOUS INHALATION SOLN
10.0000 mg/h | INHALATION_SOLUTION | Freq: Once | RESPIRATORY_TRACT | Status: AC
  Administered 2016-09-08: 10 mg/h via RESPIRATORY_TRACT
  Filled 2016-09-08: qty 20

## 2016-09-08 MED ORDER — IBUPROFEN 400 MG PO TABS
600.0000 mg | ORAL_TABLET | Freq: Once | ORAL | Status: AC
  Administered 2016-09-08: 600 mg via ORAL
  Filled 2016-09-08: qty 1

## 2016-09-08 MED ORDER — PREDNISONE 50 MG PO TABS
60.0000 mg | ORAL_TABLET | Freq: Once | ORAL | Status: AC
  Administered 2016-09-08: 60 mg via ORAL
  Filled 2016-09-08: qty 1

## 2016-09-08 MED ORDER — ALBUTEROL SULFATE (2.5 MG/3ML) 0.083% IN NEBU
5.0000 mg | INHALATION_SOLUTION | Freq: Once | RESPIRATORY_TRACT | Status: AC
  Administered 2016-09-08: 5 mg via RESPIRATORY_TRACT
  Filled 2016-09-08: qty 6

## 2016-09-08 MED ORDER — PREDNISONE 10 MG PO TABS: 60.0000 mg | ORAL_TABLET | Freq: Every day | ORAL | 0 refills | Status: DC

## 2016-09-08 MED ORDER — SODIUM CHLORIDE 0.9 % IV BOLUS (SEPSIS)
1000.0000 mL | Freq: Once | INTRAVENOUS | Status: AC
  Administered 2016-09-08: 1000 mL via INTRAVENOUS

## 2016-09-08 NOTE — ED Provider Notes (Signed)
[facility]   CSN: [id number] Arrival date & time: 09/08/16  1421     History   Chief Complaint Chief Complaint  Patient presents with  . Asthma    HPI Cameron Reeves is a 30 y.o. male.  HPI Patient presents to the emergency department with complaints of shortness of breath over the past 3 days.  He has a history of asthma.  He reports yellow phlegm a mild productive cough.  He is out of his albuterol home.  He denies history of DVT.  No unilateral leg swelling.  Symptoms are moderate in severity.  He feels short of breath when he exerts himself.  No chest discomfort at this time.   Past Medical History:  Diagnosis Date  . Asthma   . Obesity     There are no active problems to display for this patient.   Past Surgical History:  Procedure Laterality Date  . HAND SURGERY         Home Medications    Prior to Admission medications   Medication Sig Start Date End Date Taking? Authorizing Provider  albuterol (PROVENTIL,VENTOLIN) 90 MCG/ACT inhaler Inhale 2 puffs into the lungs every 6 (six) hours as needed. Shortness of breath and wheezing    Yes Historical Provider, MD  aspirin 81 MG chewable tablet Chew 81 mg by mouth once.      Historical Provider, MD  ondansetron (ZOFRAN ODT) 4 MG disintegrating tablet 4mg  ODT q4 hours prn nausea/vomit 05/31/15   Mirian MoMatthew Gentry, MD  predniSONE (DELTASONE) 20 MG tablet Take 3 tablets (60 mg total) by mouth daily. 05/31/15   Mirian MoMatthew Gentry, MD  Prenatal Vit-Fe Fumarate-FA (PRENATAL 19) tablet Chew 1-2 tablets by mouth daily.      Historical Provider, MD    Family History History reviewed. No pertinent family history.  Social History Social History  Substance Use Topics  . Smoking status: Current Some Day Smoker    Packs/day: 0.10    Types: Cigarettes  . Smokeless tobacco: Never Used  . Alcohol use Yes     Comment: occ     Allergies   Patient has no known allergies.   Review of Systems Review of  Systems  All other systems reviewed and are negative.    Physical Exam Updated Vital Signs BP 116/84 (BP Location: Right Arm)   Pulse (!) 122   Temp 100.1 F (37.8 C) (Oral)   Resp 24   Ht 5\' 10"  (1.778 m)   Wt (!) 333 lb (151 kg)   SpO2 98%   BMI 47.78 kg/m   Physical Exam  Constitutional: He is oriented to person, place, and time. He appears well-developed and well-nourished.  HENT:  Head: Normocephalic and atraumatic.  Eyes: EOM are normal.  Neck: Normal range of motion.  Cardiovascular: Normal rate, regular rhythm, normal heart sounds and intact distal pulses.   Pulmonary/Chest: Effort normal. No respiratory distress. He has wheezes.  Abdominal: Soft. He exhibits no distension. There is no tenderness.  Musculoskeletal: Normal range of motion.  Neurological: He is alert and oriented to person, place, and time.  Skin: Skin is warm and dry.  Psychiatric: He has a normal mood and affect. Judgment normal.  Nursing note and vitals reviewed.    ED Treatments / Results  Labs (all labs ordered are listed, but only abnormal results are displayed) Labs Reviewed - No data to display  EKG  EKG Interpretation None       Radiology Dg Chest 2  View  Result Date: 09/08/2016 CLINICAL DATA:  Cough, chest congestion and fever for the past 3 days. EXAM: CHEST  2 VIEW COMPARISON:  05/31/2015. FINDINGS: The heart size and mediastinal contours are within normal limits. Both lungs are clear. The visualized skeletal structures are unremarkable. IMPRESSION: Normal examination. Electronically Signed   By: Beckie SaltsSteven  Reid M.D.   On: 09/08/2016 14:50    Procedures Procedures (including critical care time)  Medications Ordered in ED Medications  predniSONE (DELTASONE) tablet 60 mg (not administered)  albuterol (PROVENTIL,VENTOLIN) solution continuous neb (not administered)  sodium chloride 0.9 % bolus 1,000 mL (not administered)  ibuprofen (ADVIL,MOTRIN) tablet 600 mg (not  administered)  albuterol (PROVENTIL) (2.5 MG/3ML) 0.083% nebulizer solution 5 mg (5 mg Nebulization Given 09/08/16 1445)  ipratropium (ATROVENT) nebulizer solution 0.5 mg (0.5 mg Nebulization Given 09/08/16 1445)     Initial Impression / Assessment and Plan / ED Course  I have reviewed the triage vital signs and the nursing notes.  Pertinent labs & imaging results that were available during my care of the patient were reviewed by me and considered in my medical decision making (see chart for details).  Clinical Course     Suspect upper respiratory tract infection and associated bronchitis with asthma exacerbation.  100.1 oral temperature here.  Patient be given IV fluids as well as ibuprofen to treat his fever.  He'll be given continuous albuterol neb and steroids.  We will reevaluate after treatment.  Feels slightly better after initial 5 mg of albuterol here in the ER  Given additional treatment.  Feels much better.  Discharge home in good condition with standard treatment for asthma and bronchitis.  He understands return to the ER for new or worsening symptoms  Final Clinical Impressions(s) / ED Diagnoses   Final diagnoses:  Moderate persistent asthma with exacerbation    New Prescriptions Discharge Medication List as of 09/08/2016  4:15 PM       Azalia BilisKevin Jamiyah Dingley, MD 09/12/16 1051

## 2016-09-08 NOTE — ED Triage Notes (Signed)
Patient with hx of asthma, c/o worsening SOB since Friday. Patient with productive cough, yellow phlegm. Patient able to speak in short phrases. Patient unaware if he has had a fever. Patient has been suing his albuterol inhaler, has a home neb machine but is out of nebulizer solution. RT requested to evaluate patient.

## 2017-05-23 ENCOUNTER — Encounter (HOSPITAL_BASED_OUTPATIENT_CLINIC_OR_DEPARTMENT_OTHER): Payer: Self-pay | Admitting: *Deleted

## 2017-05-23 ENCOUNTER — Emergency Department (HOSPITAL_BASED_OUTPATIENT_CLINIC_OR_DEPARTMENT_OTHER)
Admission: EM | Admit: 2017-05-23 | Discharge: 2017-05-23 | Disposition: A | Discharge status: Home / Self Care | Payer: 59 | Attending: Emergency Medicine | Admitting: Emergency Medicine

## 2017-05-23 DIAGNOSIS — Z79899 Other long term (current) drug therapy: Secondary | ICD-10-CM | POA: Insufficient documentation

## 2017-05-23 DIAGNOSIS — J45901 Unspecified asthma with (acute) exacerbation: Secondary | ICD-10-CM

## 2017-05-23 DIAGNOSIS — Z7982 Long term (current) use of aspirin: Secondary | ICD-10-CM | POA: Insufficient documentation

## 2017-05-23 DIAGNOSIS — F17210 Nicotine dependence, cigarettes, uncomplicated: Secondary | ICD-10-CM | POA: Insufficient documentation

## 2017-05-23 MED ORDER — PREDNISONE 20 MG PO TABS
40.0000 mg | ORAL_TABLET | Freq: Once | ORAL | Status: AC
  Administered 2017-05-23: 40 mg via ORAL
  Filled 2017-05-23: qty 2

## 2017-05-23 MED ORDER — IPRATROPIUM-ALBUTEROL 0.5-2.5 (3) MG/3ML IN SOLN
3.0000 mL | Freq: Once | RESPIRATORY_TRACT | Status: AC
  Administered 2017-05-23: 3 mL via RESPIRATORY_TRACT
  Filled 2017-05-23: qty 3

## 2017-05-23 MED ORDER — PREDNISONE 20 MG PO TABS: 40.0000 mg | ORAL_TABLET | Freq: Every day | ORAL | 0 refills | Status: AC

## 2017-05-23 MED ORDER — ALBUTEROL SULFATE HFA 108 (90 BASE) MCG/ACT IN AERS
2.0000 | INHALATION_SPRAY | Freq: Once | RESPIRATORY_TRACT | Status: AC
  Administered 2017-05-23: 2 via RESPIRATORY_TRACT
  Filled 2017-05-23: qty 6.7

## 2017-05-23 MED ORDER — ALBUTEROL SULFATE (2.5 MG/3ML) 0.083% IN NEBU
2.5000 mg | INHALATION_SOLUTION | Freq: Once | RESPIRATORY_TRACT | Status: AC
  Administered 2017-05-23: 2.5 mg via RESPIRATORY_TRACT
  Filled 2017-05-23: qty 3

## 2017-05-23 NOTE — ED Provider Notes (Signed)
[facility]   CSN: [id number] Arrival date & time: 05/23/17  [id number]     History   Chief Complaint Chief Complaint  Patient presents with  . Shortness of Breath    HPI Cameron Reeves is a 31 y.o. male.  HPI   31 year old male with shortness of breath and cough. Onset on Monday. Persistent since then. Since Wednesday he has felt "tightness in his chest. Not really painful though. Past history of asthma and states the current symptoms feel similar. No fevers or chills. No unusual leg pain or swelling.  Past Medical History:  Diagnosis Date  . Asthma   . Obesity     There are no active problems to display for this patient.   Past Surgical History:  Procedure Laterality Date  . HAND SURGERY         Home Medications    Prior to Admission medications   Medication Sig Start Date End Date Taking? Authorizing Provider  albuterol (PROVENTIL) (5 MG/ML) 0.5% nebulizer solution Take 0.5 mLs (2.5 mg total) by nebulization every 6 (six) hours as needed for wheezing or shortness of breath. 09/08/16   Azalia Bilisampos, Kevin, MD  albuterol (PROVENTIL,VENTOLIN) 90 MCG/ACT inhaler Inhale 2 puffs into the lungs every 6 (six) hours as needed. Shortness of breath and wheezing     [provider]  aspirin 81 MG chewable tablet Chew 81 mg by mouth once.      [provider]  ondansetron (ZOFRAN ODT) 4 MG disintegrating tablet 4mg  ODT q4 hours prn nausea/vomit 05/31/15   Mirian MoGentry, Matthew, MD  predniSONE (DELTASONE) 20 MG tablet Take 2 tablets (40 mg total) by mouth daily. 05/23/17   Raeford RazorKohut, Amory Zbikowski, MD  Prenatal Vit-Fe Fumarate-FA (PRENATAL 19) tablet Chew 1-2 tablets by mouth daily.      [provider]    Family History No family history on file.  Social History Social History  Substance Use Topics  . Smoking status: Current Some Day Smoker    Packs/day: 0.10    Types: Cigarettes  . Smokeless tobacco: Never Used  . Alcohol use Yes     Comment:  occ     Allergies   Patient has no known allergies.   Review of Systems Review of Systems  All systems reviewed and negative, other than as noted in HPI.  Physical Exam Updated Vital Signs BP 112/76 (BP Location: Right Arm)   Pulse 96   Temp 98.5 F (36.9 C) (Oral)   Resp (!) 22   Ht 5\' 10"  (1.778 m)   Wt 136.1 kg (300 lb)   SpO2 99%   BMI 43.05 kg/m   Physical Exam  Constitutional: He appears well-developed and well-nourished. No distress.  Sitting in bed. Obese. No acute distress. Generally appears comfortable.  HENT:  Head: Normocephalic and atraumatic.  Eyes: Conjunctivae are normal. Right eye exhibits no discharge. Left eye exhibits no discharge.  Neck: Neck supple.  Cardiovascular: Normal rate, regular rhythm and normal heart sounds.  Exam reveals no gallop and no friction rub.   No murmur heard. Pulmonary/Chest: Effort normal. No respiratory distress.  Mild expiratory wheezing. Can speak in complete sentences. No excessive muscle usage.  Abdominal: Soft. He exhibits no distension. There is no tenderness.  Musculoskeletal: He exhibits no edema or tenderness.  Lower extremities symmetric as compared to each other. No calf tenderness. Negative Homan's. No palpable cords.   Neurological: He is alert.  Skin: Skin is warm and dry.  Psychiatric: He has a  normal mood and affect. His behavior is normal. Thought content normal.  Nursing note and vitals reviewed.    ED Treatments / Results  Labs (all labs ordered are listed, but only abnormal results are displayed) Labs Reviewed - No data to display  EKG  EKG Interpretation None       Radiology No results found.  Procedures Procedures (including critical care time)  Medications Ordered in ED Medications  ipratropium-albuterol (DUONEB) 0.5-2.5 (3) MG/3ML nebulizer solution 3 mL (3 mLs Nebulization Given 05/23/17 0650)  albuterol (PROVENTIL) (2.5 MG/3ML) 0.083% nebulizer solution 2.5 mg (2.5 mg  Nebulization Given 05/23/17 0650)  albuterol (PROVENTIL HFA;VENTOLIN HFA) 108 (90 Base) MCG/ACT inhaler 2 puff (2 puffs Inhalation Given 05/23/17 0655)  predniSONE (DELTASONE) tablet 40 mg (40 mg Oral Given 05/23/17 0713)     Initial Impression / Assessment and Plan / ED Course  I have reviewed the triage vital signs and the nursing notes.  Pertinent labs & imaging results that were available during my care of the patient were reviewed by me and considered in my medical decision making (see chart for details).    Mild asthma exacerbation. Not hypoxic. Afebrile. Can speak in complete sentences. Provided with inhaler & steroids. It has been determined that no acute conditions requiring further emergency intervention are present at this time. The patient has been advised of the diagnosis and plan. I reviewed any labs and imaging including any potential incidental findings. We have discussed signs and symptoms that warrant return to the ED and they are listed in the discharge instructions.    Final Clinical Impressions(s) / ED Diagnoses   Final diagnoses:  Exacerbation of asthma, unspecified asthma severity, unspecified whether persistent    New Prescriptions New Prescriptions   PREDNISONE (DELTASONE) 20 MG TABLET    Take 2 tablets (40 mg total) by mouth daily.     Raeford Razor, MD 06/02/17 [phone]

## 2017-05-23 NOTE — ED Triage Notes (Signed)
Started w shortness of breath , cough and congestion on Monday w chest tightness on wednesday am

## 2020-03-02 ENCOUNTER — Emergency Department (HOSPITAL_COMMUNITY): Payer: 59

## 2020-03-02 ENCOUNTER — Emergency Department (HOSPITAL_COMMUNITY)
Admission: EM | Admit: 2020-03-02 | Discharge: 2020-03-02 | Disposition: A | Discharge status: Home / Self Care | Payer: 59 | Attending: Emergency Medicine | Admitting: Emergency Medicine

## 2020-03-02 ENCOUNTER — Encounter (HOSPITAL_COMMUNITY): Payer: Self-pay

## 2020-03-02 ENCOUNTER — Other Ambulatory Visit: Payer: Self-pay

## 2020-03-02 DIAGNOSIS — F1210 Cannabis abuse, uncomplicated: Secondary | ICD-10-CM | POA: Diagnosis not present

## 2020-03-02 DIAGNOSIS — F17210 Nicotine dependence, cigarettes, uncomplicated: Secondary | ICD-10-CM | POA: Insufficient documentation

## 2020-03-02 DIAGNOSIS — F10920 Alcohol use, unspecified with intoxication, uncomplicated: Secondary | ICD-10-CM | POA: Insufficient documentation

## 2020-03-02 DIAGNOSIS — F10129 Alcohol abuse with intoxication, unspecified: Secondary | ICD-10-CM | POA: Diagnosis present

## 2020-03-02 DIAGNOSIS — Z7982 Long term (current) use of aspirin: Secondary | ICD-10-CM | POA: Insufficient documentation

## 2020-03-02 DIAGNOSIS — J45909 Unspecified asthma, uncomplicated: Secondary | ICD-10-CM | POA: Insufficient documentation

## 2020-03-02 DIAGNOSIS — Z79899 Other long term (current) drug therapy: Secondary | ICD-10-CM | POA: Diagnosis not present

## 2020-03-02 MED ORDER — M.V.I. ADULT IV INJ
INJECTION | Freq: Once | INTRAVENOUS | Status: AC
  Filled 2020-03-02: qty 1000

## 2020-03-02 NOTE — ED Provider Notes (Addendum)
[facility] DEPT Provider Note   CSN: [id number] Arrival date & time: 03/02/20  3790     History Chief Complaint  Patient presents with  . Ingestion  . Alcohol Intoxication    Cameron Reeves is a 34 y.o. male.  Patient with heavy alcohol use last night.  Found intoxicated by his girlfriend in his bathtub.  Sent for evaluation as he still seemed pretty intoxicated.  Patient said he fell but does not have any headache or head pain.  Did not notice any bruising to his head.  The history is provided by the patient.  Ingestion This is a new problem. The current episode started 3 to 5 hours ago. Pertinent negatives include no chest pain, no abdominal pain, no headaches and no shortness of breath. Nothing aggravates the symptoms. Nothing relieves the symptoms. He has tried nothing for the symptoms. The treatment provided no relief.  Alcohol Intoxication Pertinent negatives include no chest pain, no abdominal pain, no headaches and no shortness of breath.       Past Medical History:  Diagnosis Date  . Asthma   . Obesity     There are no problems to display for this patient.   Past Surgical History:  Procedure Laterality Date  . HAND SURGERY         History reviewed. No pertinent family history.  Social History   Tobacco Use  . Smoking status: Current Some Day Smoker    Packs/day: 0.10    Types: Cigarettes  . Smokeless tobacco: Never Used  Substance Use Topics  . Alcohol use: Yes    Comment: occ  . Drug use: Yes    Types: Marijuana    Comment: denies 09/08/16    Home Medications Prior to Admission medications   Medication Sig Start Date End Date Taking? Authorizing Provider  albuterol (PROVENTIL) (5 MG/ML) 0.5% nebulizer solution Take 0.5 mLs (2.5 mg total) by nebulization every 6 (six) hours as needed for wheezing or shortness of breath. 09/08/16   Jola Schmidt, MD  albuterol (PROVENTIL,VENTOLIN) 90 MCG/ACT inhaler Inhale 2 puffs  into the lungs every 6 (six) hours as needed. Shortness of breath and wheezing     [provider]  aspirin 81 MG chewable tablet Chew 81 mg by mouth once.      [provider]  ondansetron (ZOFRAN ODT) 4 MG disintegrating tablet 4mg  ODT q4 hours prn nausea/vomit 05/31/15   Debby Freiberg, MD  predniSONE (DELTASONE) 20 MG tablet Take 2 tablets (40 mg total) by mouth daily. 05/23/17   Virgel Manifold, MD  Prenatal Vit-Fe Fumarate-FA (PRENATAL 19) tablet Chew 1-2 tablets by mouth daily.      [provider]    Allergies    Patient has no known allergies.  Review of Systems   Review of Systems  Constitutional: Negative for chills and fever.  HENT: Negative for ear pain and sore throat.   Eyes: Negative for pain and visual disturbance.  Respiratory: Negative for cough and shortness of breath.   Cardiovascular: Negative for chest pain and palpitations.  Gastrointestinal: Negative for abdominal pain and vomiting.  Genitourinary: Negative for dysuria and hematuria.  Musculoskeletal: Negative for arthralgias and back pain.  Skin: Negative for color change and rash.  Neurological: Negative for seizures, syncope and headaches.  All other systems reviewed and are negative.   Physical Exam Updated Vital Signs  ED Triage Vitals  Enc Vitals Group     BP 03/02/20 1000 (!) 131/91  Pulse Rate 03/02/20 1000 92     Resp 03/02/20 1000 18     Temp 03/02/20 1000 98.1 F (36.7 C)     Temp Source 03/02/20 1000 Oral     SpO2 03/02/20 1000 99 %     Weight --      Height --      Head Circumference --      Peak Flow --      Pain Score 03/02/20 1001 0     Pain Loc --      Pain Edu? --      Excl. in GC? --     Physical Exam Vitals and nursing note reviewed.  Constitutional:      General: He is not in acute distress.    Appearance: He is well-developed. He is not ill-appearing.  HENT:     Head: Normocephalic and atraumatic.     Comments: No evidence of any head  injury or trauma Eyes:     Conjunctiva/sclera: Conjunctivae normal.  Cardiovascular:     Rate and Rhythm: Normal rate and regular rhythm.     Heart sounds: No murmur heard.   Pulmonary:     Effort: Pulmonary effort is normal. No respiratory distress.     Breath sounds: Normal breath sounds.  Abdominal:     Palpations: Abdomen is soft.     Tenderness: There is no abdominal tenderness.  Musculoskeletal:     Cervical back: Neck supple.  Skin:    General: Skin is warm and dry.     Capillary Refill: Capillary refill takes less than 2 seconds.  Neurological:     General: No focal deficit present.     Mental Status: He is alert and oriented to person, place, and time.     Cranial Nerves: No cranial nerve deficit.     Sensory: No sensory deficit.     Motor: No weakness.     Coordination: Coordination normal.     Comments: Appears mildly intoxicated but neurologically intact     ED Results / Procedures / Treatments   Labs (all labs ordered are listed, but only abnormal results are displayed) Labs Reviewed - No data to display  EKG None  Radiology CT Head Wo Contrast  Result Date: 03/02/2020 CLINICAL DATA:  Questionable fall.  Intoxication. EXAM: CT HEAD WITHOUT CONTRAST TECHNIQUE: Contiguous axial images were obtained from the base of the skull through the vertex without intravenous contrast. COMPARISON:  None. FINDINGS: Brain: The ventricles and sulci are normal in size and configuration. There is no intracranial mass, hemorrhage, extra-axial fluid collection, or midline shift. The brain parenchyma appears unremarkable. There is no demonstrable acute infarct. Vascular: No hyperdense vessel.  No evident vascular calcification. Skull: The bony calvarium appears intact. Sinuses/Orbits: Visualized paranasal sinuses are clear. Orbits appear symmetric bilaterally. Other: Mastoid air cells are clear. IMPRESSION: Study within normal limits. Electronically Signed   By: Bretta Bang III  M.D.   On: 03/02/2020 13:33    Procedures Procedures (including critical care time)  Medications Ordered in ED Medications  sodium chloride 0.9 % 1,000 mL with thiamine 100 mg, folic acid 1 mg, multivitamins adult 10 mL infusion ( Intravenous Stopped 03/02/20 1255)    ED Course  I have reviewed the triage vital signs and the nursing notes.  Pertinent labs & imaging results that were available during my care of the patient were reviewed by me and considered in my medical decision making (see chart for details).    MDM Rules/Calculators/A&P  Dorrance Sellick is a 34 year old male with no significant medical history presents to the ED with alcohol intoxication.  Patient with normal vitals.  No fever.  Heavy alcohol use overnight.  Denies any suicidal homicidal ideation.  States that he has been under a lot of stress at work and just felt the need to get drunk.  He was found in his bathtub by his girlfriend still intoxicated and patient sent for evaluation.  Denies any heavy alcohol use daily.  Drinks socially.  Says that he hit his head but did not lose consciousness.  He does not want a head CT and he appears to have capacity to make this decision.  Will give IV fluids and allow patient to metabolize and reevaluate.  Overall he appears neurologically intact.  There is no signs of head trauma on exam.  Patient did get head CT and was unremarkable.  Patient to be observed for further metabolizing of alcohol.  Anticipate discharge after he is more ambulatory.  Patient is now awake and alert.  He does admit to using Xanax last night and drinking alcohol.  Denies any suicidal thoughts.  He does state that he self medicates for sleep problems and anxiety.  We will give him information to follow-up with mental health doctor if he prefers to see one.  He understands return precautions.  This chart was dictated using voice recognition software.  Despite best efforts to proofread,   errors can occur which can change the documentation meaning.    Final Clinical Impression(s) / ED Diagnoses Final diagnoses:  Alcoholic intoxication without complication [facility])    Rx / DC Orders ED Discharge Orders    None       Virgina Norfolk, DO 03/02/20 1415    Virgina Norfolk, DO 03/02/20 1506

## 2020-03-02 NOTE — ED Triage Notes (Signed)
EMS reports from home, Pt admits drinking entire bottle of [facility], a beer, and a Xanax finishing about 0500 this morning. Found naked in bathtub by girlfriend.  BP 140/80 HR 75 RR 16 Sp02 100 RA CBG 107  20ga L forearm NS enroute

## 2020-03-02 NOTE — ED Notes (Signed)
Pt now awake, sitting up, eating a sand which and drinking gingerale, called number listed in chart, wrong number. Pt also provided wrong number, says he will try to call someone to come get him

## 2020-06-21 ENCOUNTER — Emergency Department (HOSPITAL_COMMUNITY)
Admission: EM | Admit: 2020-06-21 | Discharge: 2020-06-23 | Disposition: E | Payer: 59 | Attending: Emergency Medicine | Admitting: Emergency Medicine

## 2020-06-21 DIAGNOSIS — I469 Cardiac arrest, cause unspecified: Secondary | ICD-10-CM | POA: Insufficient documentation

## 2020-06-21 DIAGNOSIS — J45909 Unspecified asthma, uncomplicated: Secondary | ICD-10-CM | POA: Insufficient documentation

## 2020-06-21 DIAGNOSIS — F17210 Nicotine dependence, cigarettes, uncomplicated: Secondary | ICD-10-CM | POA: Insufficient documentation

## 2020-06-21 LAB — CBG MONITORING, ED: Glucose-Capillary: 231 mg/dL — ABNORMAL HIGH (ref 70–99)

## 2020-06-21 MED FILL — Medication: Qty: 1 | Status: AC

## 2020-06-23 NOTE — ED Triage Notes (Signed)
Per EMS,. Pt from home, family said he got up to go to the bathroom and they found him unresponsive in the bathroom (estimated down time was 20 minutes).  Family reports he had 1 xanax.   Initial Astolic on monitor, CPR total 45 minutes w/ 7 epi given, PEA.  IO in left prox tib..  Into ED on lucas.

## 2020-06-23 NOTE — ED Provider Notes (Signed)
[facility] EMERGENCY DEPARTMENT Provider Note   CSN: [id number] Arrival date & time: 2020-06-27  0138     History Chief Complaint  Patient presents with  . Cardiac Arrest    Cameron Reeves is a 34 y.o. male.  HPI     This is a 34 year old male who presents by EMS in cardiac arrest.  History provided by EMS was that patient went into the bathroom to presumably smoke a cigarette.  He was found down by family and friends.  He was reported down for 20 minutes prior to EMS arrival.  He had approximately 45 minutes of ACLS prior to arrival in the emergency department.  Reports 7 rounds of epinephrine.  Unknown whether he had had a recent medical problems or illnesses.  EMS placed a [facility] airway.  He was initially asystole and then in PEA arrest.  Level 5 caveat  Past Medical History:  Diagnosis Date  . Asthma   . Obesity     There are no problems to display for this patient.   Past Surgical History:  Procedure Laterality Date  . HAND SURGERY         No family history on file.  Social History   Tobacco Use  . Smoking status: Current Some Day Smoker    Packs/day: 0.10    Types: Cigarettes  . Smokeless tobacco: Never Used  Substance Use Topics  . Alcohol use: Yes    Comment: occ  . Drug use: Yes    Types: Marijuana    Comment: denies 09/08/16    Home Medications Prior to Admission medications   Medication Sig Start Date End Date Taking? Authorizing Provider  albuterol (PROVENTIL) (5 MG/ML) 0.5% nebulizer solution Take 0.5 mLs (2.5 mg total) by nebulization every 6 (six) hours as needed for wheezing or shortness of breath. 09/08/16   Azalia Bilis, MD  albuterol (PROVENTIL,VENTOLIN) 90 MCG/ACT inhaler Inhale 2 puffs into the lungs every 6 (six) hours as needed. Shortness of breath and wheezing     [provider]  ondansetron (ZOFRAN ODT) 4 MG disintegrating tablet 4mg  ODT q4 hours prn nausea/vomit Patient not taking: Reported on 03/02/2020  05/31/15   07/31/15, MD  predniSONE (DELTASONE) 20 MG tablet Take 2 tablets (40 mg total) by mouth daily. 05/23/17   05/25/17, MD    Allergies    Patient has no known allergies.  Review of Systems   Review of Systems  Unable to perform ROS: Patient unresponsive    Physical Exam Updated Vital Signs There were no vitals taken for this visit.  Physical Exam Vitals and nursing note reviewed.  Constitutional:      Comments: Unresponsive, ACLS ongoing  HENT:     Head: Normocephalic and atraumatic.     Nose: Nose normal.     Mouth/Throat:     Mouth: Mucous membranes are dry.  Eyes:     Comments: Pupils 9 mm and unreactive bilaterally  Cardiovascular:     Pulses: Normal pulses.  Pulmonary:     Comments: No spontaneous respiratory effort, breath sounds bilaterally with King airway in place Abdominal:     General: There is no distension.  Musculoskeletal:     Cervical back: Neck supple.     Comments: I/O left tibia  Skin:    General: Skin is dry.  Neurological:     Comments: GCS 3  Psychiatric:     Comments: Unable to assess     ED Results / Procedures / Treatments  Labs (all labs ordered are listed, but only abnormal results are displayed) Labs Reviewed  CBG MONITORING, ED - Abnormal; Notable for the following components:      Result Value   Glucose-Capillary 231 (*)    All other components within normal limits    EKG None  Radiology No results found.  Procedures CPR  Date/Time: Jun 29, 2020 2:05 AM Performed by: Shon Baton, MD Authorized by: Shon Baton, MD  CPR Procedure Details:      Amount of time prior to administration of ACLS/BLS (minutes):  0   ACLS/BLS initiated by EMS: Yes     CPR/ACLS performed in the ED: Yes     Duration of CPR (minutes):  10   Outcome: Pt declared dead    CPR performed via ACLS guidelines under my direct supervision.  See RN documentation for details including defibrillator use, medications, doses and  timing. .Critical Care Performed by: Shon Baton, MD Authorized by: Shon Baton, MD   Critical care provider statement:    Critical care time (minutes):  25   Critical care was time spent personally by me on the following activities:  Discussions with consultants, evaluation of patient's response to treatment, examination of patient, ordering and performing treatments and interventions, ordering and review of laboratory studies, ordering and review of radiographic studies, pulse oximetry, re-evaluation of patient's condition, obtaining history from patient or surrogate and review of old charts   (including critical care time)  Medications Ordered in ED Medications - No data to display  ED Course  I have reviewed the triage vital signs and the nursing notes.  Pertinent labs & imaging results that were available during my care of the patient were reviewed by me and considered in my medical decision making (see chart for details).    MDM Rules/Calculators/A&P                           Patient presented in cardiac arrest.  Had been down approximately 1 hour and 5 minutes prior to our evaluation.  He remained in PEA arrest.  He received ongoing ACLS for approximately 7 more minutes with 2 rounds of epinephrine, an amp of bicarb, and CBG which was 231.  Pupils fixed and dilated and no meaningful neurologic exam.  Airway was not replaced as we are getting good breath sounds and aeration with King airway in place.  Felt it was adequate.   ACLS was discontinued.  Bedside ultrasound showed slight quivering of the heart but ultimately standstill.  Quivering likely related to epinephrine administration.  Unclear etiology of his arrest.  Time of death 44.  Medical examiner was advised and will take the case.  Spoke with Mellody Dance from the medical examiner office.  Final Clinical Impression(s) / ED Diagnoses Final diagnoses:  Cardiac arrest [facility])    Rx / DC Orders ED Discharge Orders      None       Kieren Ricci, Mayer Masker, MD 06-29-2020 0206

## 2020-06-23 NOTE — Code Documentation (Signed)
   135 - pulse check - no pulse, restart CPR 138- pulse check - no pulse, restart CPR 139 - bicarb in, 1 epi in 141-  Pulse check, no pulse - restart CPR 20 G in Right AC 142 epi in 145 - pulse check - no pulse. Provider confirming w/ ultra sound - no cardiac activity Time of death 145

## 2020-06-23 DEATH — deceased

## 2021-11-09 IMAGING — CT CT HEAD W/O CM
3 series · 16 of 47 positions shown, 19 images · non-contrast
Comparison: None.

CLINICAL DATA: Questionable fall.  Intoxication.

EXAM:
CT HEAD WITHOUT CONTRAST
TECHNIQUE: Contiguous axial images were obtained from the base of the skull
through the vertex without intravenous contrast.

[Series 3: head wo · axial · 0.50mm/px · z∈[-211,-71]mm · 10 of 34 slices shown, 13 images]
[im 3/34  brain]
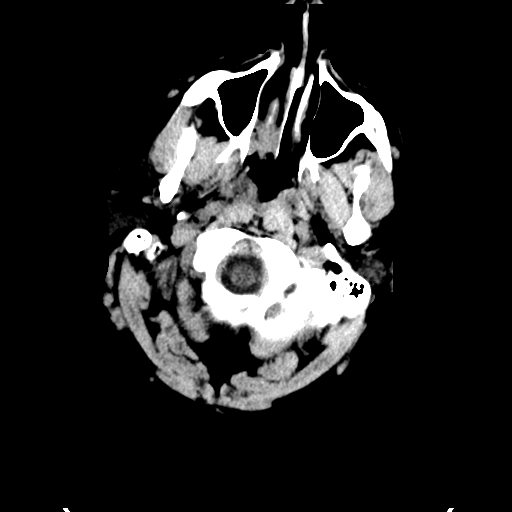
[im 3/34  bone]
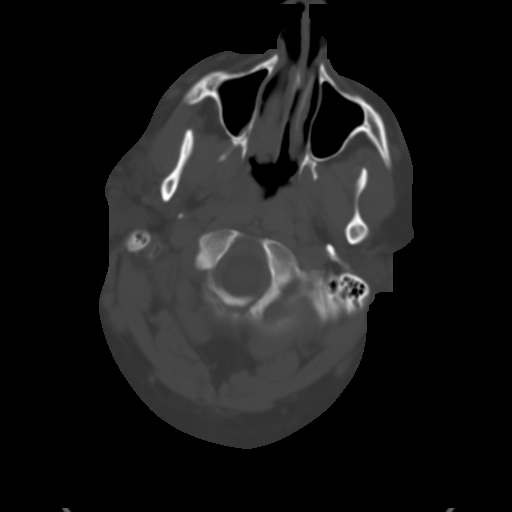
[im 6/34  brain]
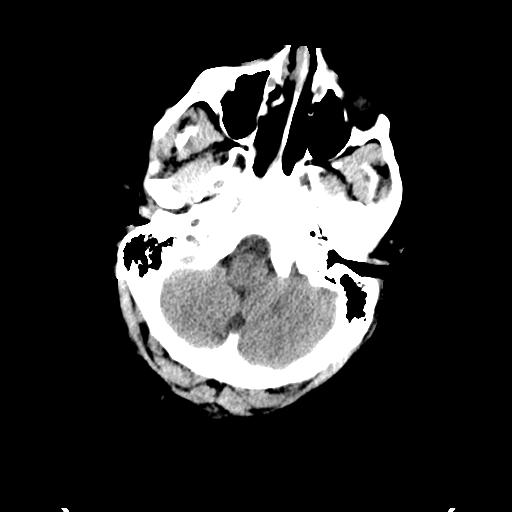
[im 10/34  brain]
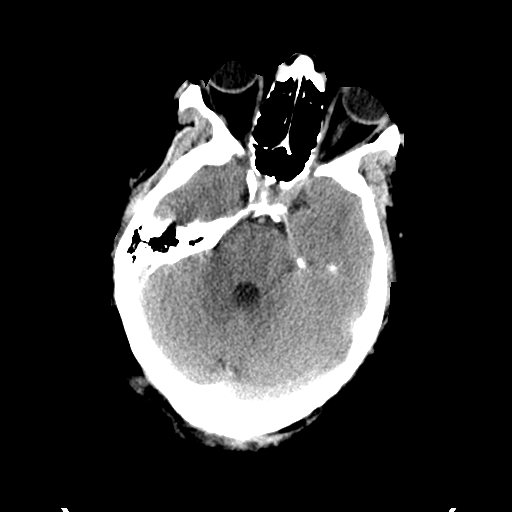
[im 12/34  brain]
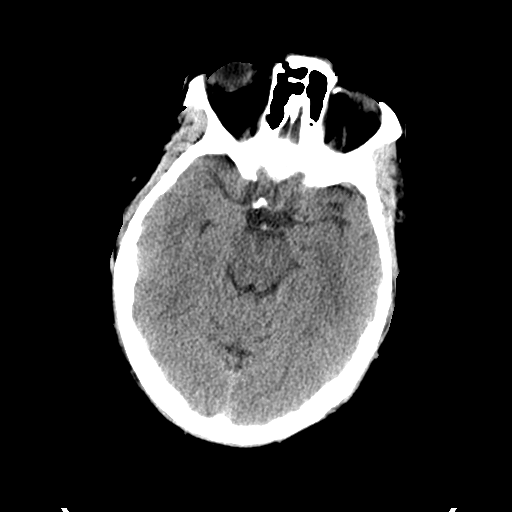
[im 15/34  brain]
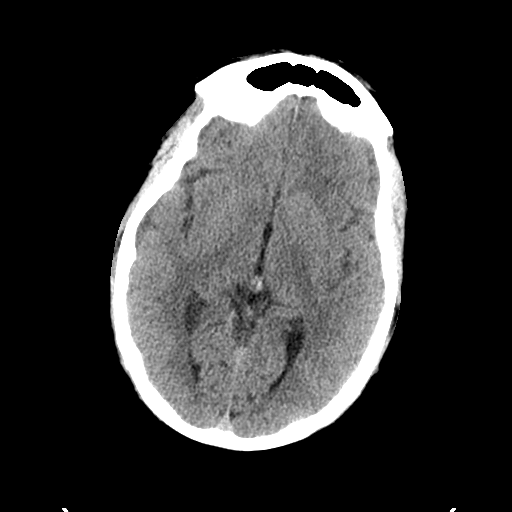
[im 15/34  bone]
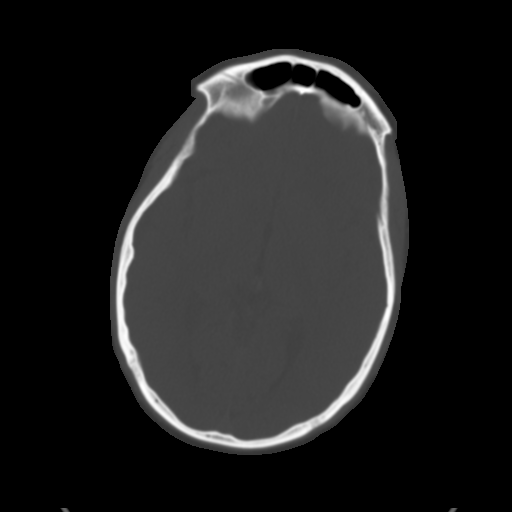
[im 19/34  brain]
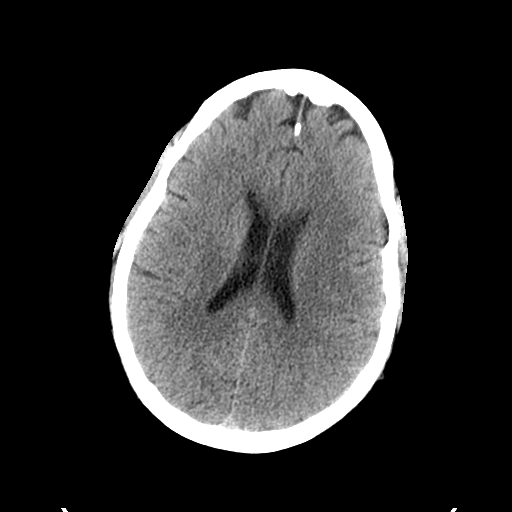
[im 22/34  brain]
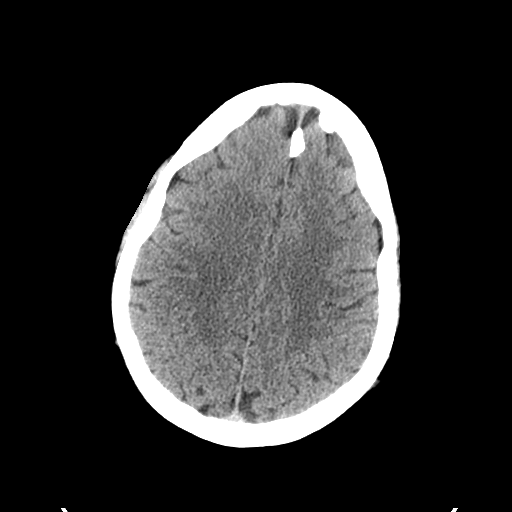
[im 26/34  brain]
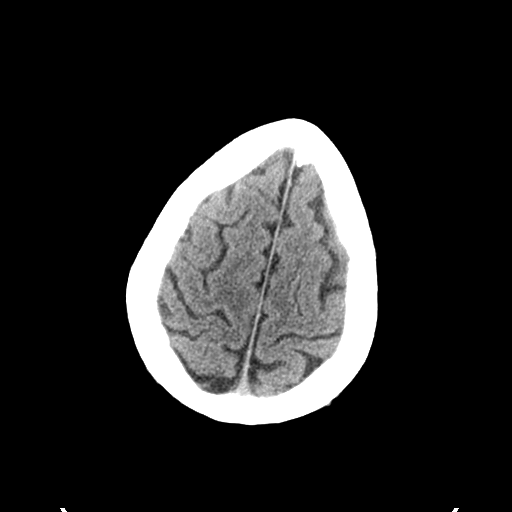
[im 28/34  brain]
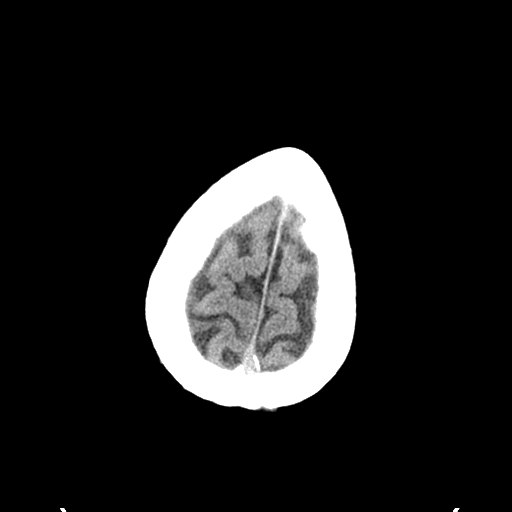
[im 28/34  bone]
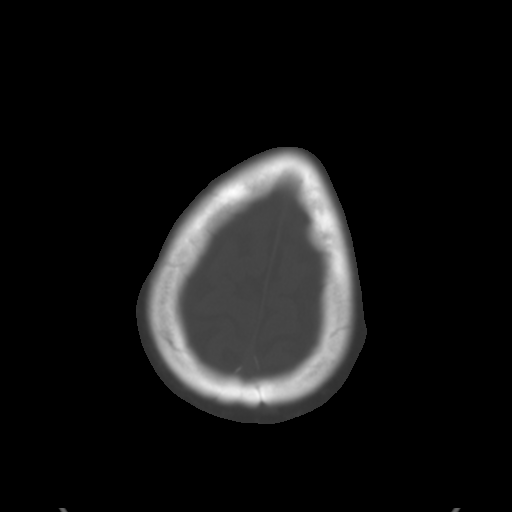
[im 31/34  brain]
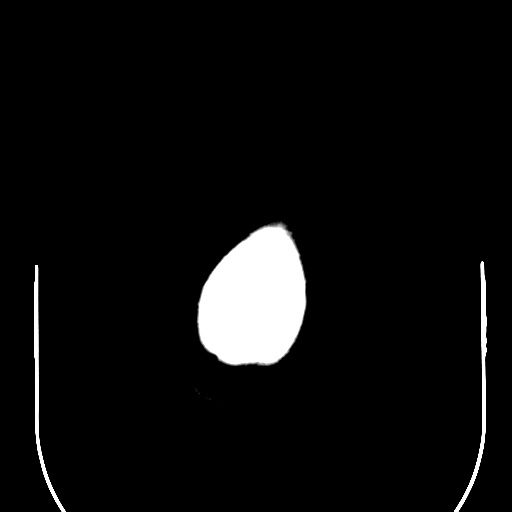

[Series 5: coronal soft tissue · coronal · 0.34mm/px · 3 of 70 slices shown]
[im 26/70  brain]
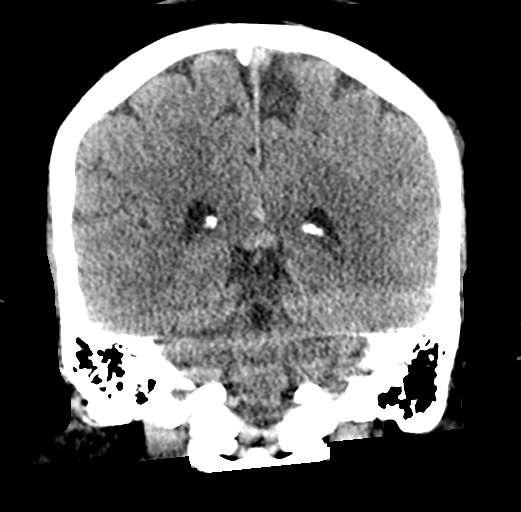
[im 32/70  brain]
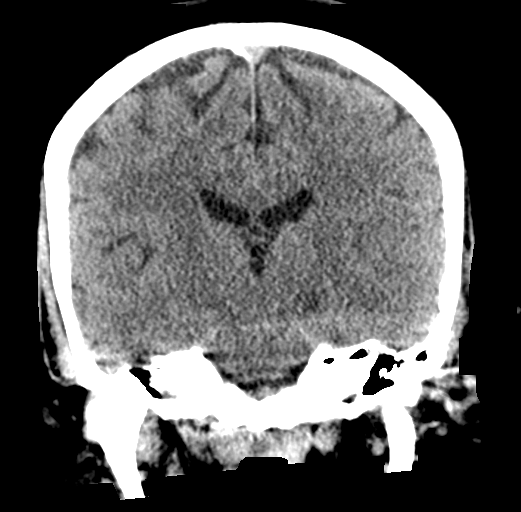
[im 38/70  brain]
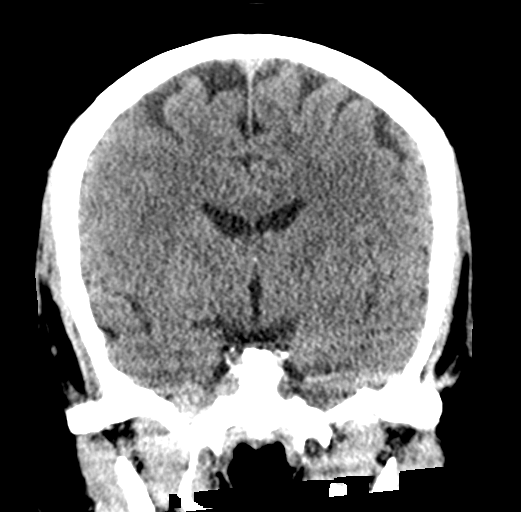

[Series 6: sagittal soft tissue · sagittal · 0.34mm/px · 3 of 60 slices shown]
[im 22/60  brain]
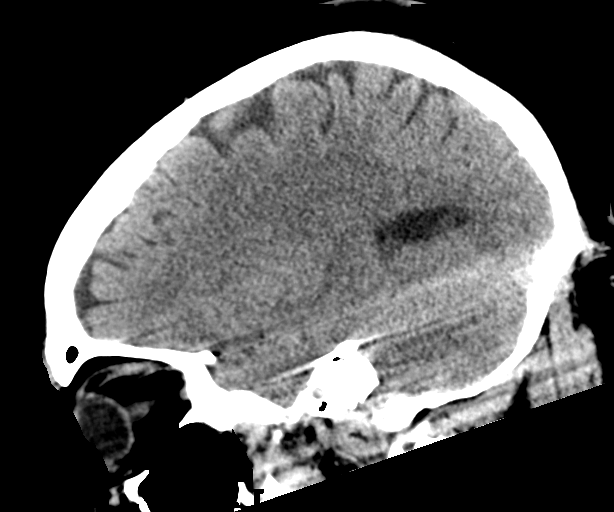
[im 30/60  brain]
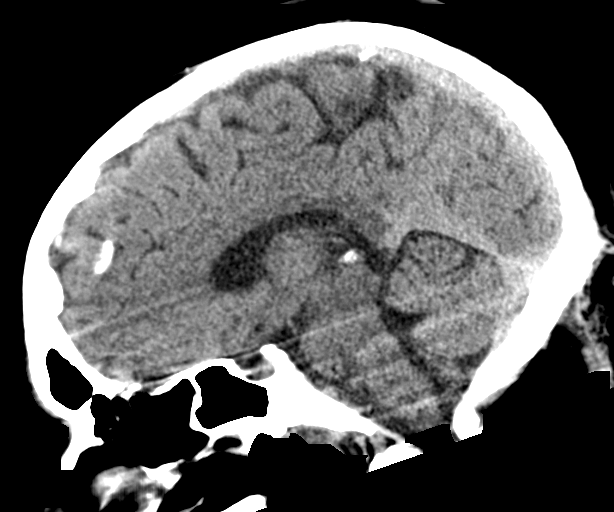
[im 38/60  brain]
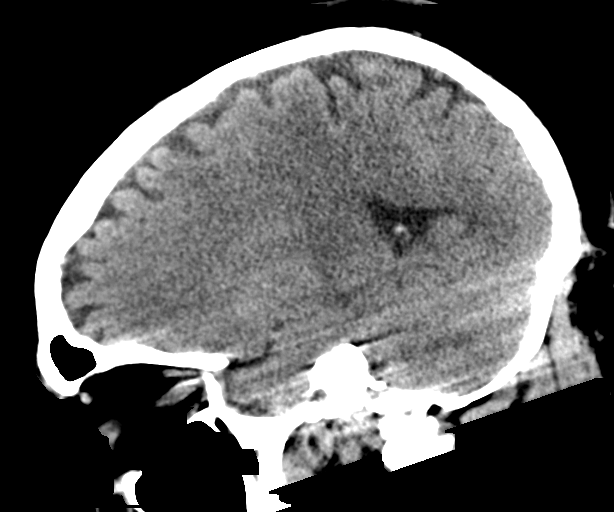

[16 of 47 positions shown; findings below may reference images not displayed]

FINDINGS: Brain: The ventricles and sulci are normal in size and
configuration. There is no intracranial mass, hemorrhage,
extra-axial fluid collection, or midline shift. The brain parenchyma
appears unremarkable. There is no demonstrable acute infarct.

Vascular: No hyperdense vessel.  No evident vascular calcification.

Skull: The bony calvarium appears intact.

Sinuses/Orbits: Visualized paranasal sinuses are clear. Orbits
appear symmetric bilaterally.

Other: Mastoid air cells are clear.
IMPRESSION: Study within normal limits.
